# Patient Record
Sex: Female | Born: 1970 | Race: White | Hispanic: No | Marital: Married | Smoking: Former smoker
Health system: Southern US, Community
[De-identification: ages and names within clinical notes are randomized; demographics above are authoritative.]

## PROBLEM LIST (undated history)

## (undated) DIAGNOSIS — K219 Gastro-esophageal reflux disease without esophagitis: Secondary | ICD-10-CM

## (undated) DIAGNOSIS — R51 Headache: Secondary | ICD-10-CM

## (undated) DIAGNOSIS — F32A Depression, unspecified: Secondary | ICD-10-CM

## (undated) DIAGNOSIS — G43909 Migraine, unspecified, not intractable, without status migrainosus: Secondary | ICD-10-CM

## (undated) DIAGNOSIS — F329 Major depressive disorder, single episode, unspecified: Secondary | ICD-10-CM

## (undated) DIAGNOSIS — R519 Headache, unspecified: Secondary | ICD-10-CM

## (undated) DIAGNOSIS — B019 Varicella without complication: Secondary | ICD-10-CM

## (undated) DIAGNOSIS — T7840XA Allergy, unspecified, initial encounter: Secondary | ICD-10-CM

## (undated) HISTORY — DX: Migraine, unspecified, not intractable, without status migrainosus: G43.909

## (undated) HISTORY — DX: Allergy, unspecified, initial encounter: T78.40XA

## (undated) HISTORY — DX: Headache, unspecified: R51.9

## (undated) HISTORY — DX: Major depressive disorder, single episode, unspecified: F32.9

## (undated) HISTORY — PX: OTHER SURGICAL HISTORY: SHX169

## (undated) HISTORY — DX: Varicella without complication: B01.9

## (undated) HISTORY — DX: Gastro-esophageal reflux disease without esophagitis: K21.9

## (undated) HISTORY — DX: Headache: R51

## (undated) HISTORY — DX: Depression, unspecified: F32.A

---

## 1989-02-03 HISTORY — PX: RECONSTRUCTION MID-FACE: SUR1085

## 1993-02-03 HISTORY — PX: TUBAL LIGATION: SHX77

## 2012-04-19 ENCOUNTER — Encounter (HOSPITAL_COMMUNITY): Payer: Self-pay | Admitting: *Deleted

## 2012-04-19 ENCOUNTER — Emergency Department (HOSPITAL_COMMUNITY)
Admission: EM | Admit: 2012-04-19 | Discharge: 2012-04-19 | Disposition: A | Discharge status: Home / Self Care | Payer: BC Managed Care – PPO | Source: Home / Self Care | Attending: Emergency Medicine | Admitting: Emergency Medicine

## 2012-04-19 DIAGNOSIS — M549 Dorsalgia, unspecified: Secondary | ICD-10-CM

## 2012-04-19 MED ORDER — KETOROLAC TROMETHAMINE 60 MG/2ML IM SOLN
60.0000 mg | Freq: Once | INTRAMUSCULAR | Status: AC
  Administered 2012-04-19: 60 mg via INTRAMUSCULAR

## 2012-04-19 MED ORDER — HYDROCODONE-ACETAMINOPHEN 7.5-325 MG PO TABS: 1.0000 | ORAL_TABLET | ORAL | Status: DC | PRN

## 2012-04-19 MED ORDER — KETOROLAC TROMETHAMINE 60 MG/2ML IM SOLN
INTRAMUSCULAR | Status: AC
  Filled 2012-04-19: qty 2

## 2012-04-19 MED ORDER — CYCLOBENZAPRINE HCL 5 MG PO TABS: 5.0000 mg | ORAL_TABLET | Freq: Three times a day (TID) | ORAL | Status: DC | PRN

## 2012-04-19 NOTE — ED Provider Notes (Signed)
Medical screening examination/treatment/procedure(s) were performed by non-physician practitioner and as supervising physician I was immediately available for consultation/collaboration.  Leslee Home, M.D.  Reuben Likes, MD 04/19/12 2114

## 2012-04-19 NOTE — ED Notes (Signed)
PT     REPORTS                 SHE W AS   WORKING   SEV  DAYS AGO   CUTTING  WOOD  WHEN SHE  RAISED  UP  AND  FELT A  PAIN IN  LOWER  BACK      SHE  AMBY=ULATED TO  ROOM WITH A  SLOW STEADY  GAIT

## 2012-04-19 NOTE — ED Provider Notes (Signed)
History     CSN: [id number]  Arrival date & time 04/19/12  1118   First MD Initiated Contact with Patient 04/19/12 1200      Chief Complaint  Patient presents with  . Back Pain    (Consider location/radiation/quality/duration/timing/severity/associated sxs/prior treatment) HPI Comments: 42 year old female presents with mid and low back pain. She had been lifting limbs 3 days ago. The following day he she had no back pain or other problems. Yesterday after waking she was working with a fire and burning that would and squatting down. When she braced herself back up she experienced severe sudden back pain. Pain is located in the lower para thoracic and lumbosacral musculature. Pain is worse with prolonged standing, in the bending or movement. The pain is constant. She is taking NSAIDs which offered no relief. She denies radiation of pain into her legs, weakness in the lower extremities or numbness.   History reviewed. No pertinent past medical history.  Past Surgical History  Procedure Laterality Date  . Btl      History reviewed. No pertinent family history.  History  Substance Use Topics  . Smoking status: Not on file  . Smokeless tobacco: Not on file  . Alcohol Use: Not on file    OB History   Grav Para Term Preterm Abortions TAB SAB Ect Mult Living                  Review of Systems  Constitutional: Negative for fever, activity change and fatigue.  HENT: Negative.   Respiratory: Negative for cough, shortness of breath and wheezing.   Cardiovascular: Negative for chest pain and palpitations.  Gastrointestinal: Negative.   Genitourinary: Negative.   Musculoskeletal: Positive for myalgias and back pain.       As per history of present illness  Skin: Negative for color change, pallor and rash.  Neurological: Negative.   Psychiatric/Behavioral: Negative.     Allergies  Peanuts and Soybeans  Home Medications   Current Outpatient Rx  Name  Route  Sig  Dispense   Refill  . Ibuprofen (ADVIL) 200 MG CAPS   Oral   Take by mouth.         . Liniments (BEN GAY EX)   Apply externally   Apply topically.         . Naproxen Sodium (ALEVE PO)   Oral   Take by mouth.         . cyclobenzaprine (FLEXERIL) 5 MG tablet   Oral   Take 1 tablet (5 mg total) by mouth 3 (three) times daily as needed for muscle spasms.   21 tablet   0   . HYDROcodone-acetaminophen (NORCO) 7.5-325 MG per tablet   Oral   Take 1 tablet by mouth every 4 (four) hours as needed for pain.   15 tablet   0     BP 127/70  Pulse 59  Temp(Src) 98.5 F (36.9 C) (Oral)  Resp 20  SpO2 100%  LMP 04/17/2012  Physical Exam  Nursing note and vitals reviewed. Constitutional: She is oriented to person, place, and time. She appears well-developed and well-nourished. No distress.  HENT:  Head: Normocephalic and atraumatic.  Eyes: EOM are normal.  Neck: Normal range of motion. Neck supple.  Cardiovascular: Normal rate.   Pulmonary/Chest: Effort normal. No respiratory distress.  Musculoskeletal: She exhibits no edema.  Tenderness in the mid parathoracic musculature and greater tenderness in the para lumbosacral musculature. No swelling or deformity. There is mild tenderness across  the lumbar spine the tenderness is produced with very light palpation to the skin only. No weakness in the lower extremities on exam.  Neurological: She is alert and oriented to person, place, and time. No cranial nerve deficit. She exhibits normal muscle tone.  Skin: Skin is warm and dry.  Psychiatric: She has a normal mood and affect.    ED Course  Procedures (including critical care time)  Labs Reviewed - No data to display No results found.   1. Sprain, lumbosacral, initial encounter   2. Acute back pain       MDM  Neurologic symptoms associated with the back pain. Symptoms and exam are compatible with musculoskeletal strain. No heavy lifting bending, twisting, pulling. Rest but did  not go to bed. Start slowly with lowering of stretches as demonstrated and discussed. Apply heat to the low back. Toradol 60 mg IM prior to discharge May continue to take Naprosyn or Aleve at home as needed. Flexeril 5 mg every 8 hours as needed for muscle relaxant. Norco 5 one every 4 hours when necessary pain #15. Followup with your primary care doctor as needed. For any new symptoms problems or worsening may return   Hayden Rasmussen, NP 04/19/12 1326

## 2013-06-08 ENCOUNTER — Other Ambulatory Visit: Payer: Self-pay | Admitting: Obstetrics & Gynecology

## 2013-06-08 ENCOUNTER — Other Ambulatory Visit (HOSPITAL_COMMUNITY)
Admission: RE | Admit: 2013-06-08 | Discharge: 2013-06-08 | Disposition: A | Discharge status: Home / Self Care | Payer: BC Managed Care – PPO | Source: Ambulatory Visit | Attending: Obstetrics & Gynecology | Admitting: Obstetrics & Gynecology

## 2013-06-08 DIAGNOSIS — Z01419 Encounter for gynecological examination (general) (routine) without abnormal findings: Secondary | ICD-10-CM | POA: Insufficient documentation

## 2013-06-08 DIAGNOSIS — Z1151 Encounter for screening for human papillomavirus (HPV): Secondary | ICD-10-CM | POA: Insufficient documentation

## 2013-06-08 LAB — HM PAP SMEAR: HM Pap smear: NEGATIVE

## 2016-01-24 DIAGNOSIS — J029 Acute pharyngitis, unspecified: Secondary | ICD-10-CM | POA: Diagnosis not present

## 2016-07-01 DIAGNOSIS — Z01419 Encounter for gynecological examination (general) (routine) without abnormal findings: Secondary | ICD-10-CM | POA: Diagnosis not present

## 2017-01-20 ENCOUNTER — Encounter: Payer: Self-pay | Admitting: Nurse Practitioner

## 2017-01-20 ENCOUNTER — Ambulatory Visit: Payer: BLUE CROSS/BLUE SHIELD | Admitting: Nurse Practitioner

## 2017-01-20 ENCOUNTER — Ambulatory Visit (INDEPENDENT_AMBULATORY_CARE_PROVIDER_SITE_OTHER): Payer: BLUE CROSS/BLUE SHIELD

## 2017-01-20 VITALS — BP 122/76 | HR 60 | Temp 98.5°F | Ht 66.5 in | Wt 176.0 lb

## 2017-01-20 DIAGNOSIS — M25552 Pain in left hip: Secondary | ICD-10-CM | POA: Diagnosis not present

## 2017-01-20 DIAGNOSIS — M25551 Pain in right hip: Secondary | ICD-10-CM

## 2017-01-20 DIAGNOSIS — M79676 Pain in unspecified toe(s): Secondary | ICD-10-CM | POA: Diagnosis not present

## 2017-01-20 DIAGNOSIS — G8929 Other chronic pain: Secondary | ICD-10-CM | POA: Diagnosis not present

## 2017-01-20 LAB — SEDIMENTATION RATE: SED RATE: 8 mm/h (ref 0–20)

## 2017-01-20 LAB — VITAMIN D 25 HYDROXY (VIT D DEFICIENCY, FRACTURES): VITD: 23.95 ng/mL — ABNORMAL LOW (ref 30.00–100.00)

## 2017-01-20 MED ORDER — DICLOFENAC SODIUM 2 % TD SOLN: 1.0000 [in_us] | Freq: Two times a day (BID) | TRANSDERMAL | 0 refills | Status: AC

## 2017-01-20 NOTE — Patient Instructions (Addendum)
encourage to go half size up in shoe size and wider. Toe pain is due to repeated trauma.  Normal hip  And pelvic x-ray. Normal sed rate and Rh factor. Pending ANA.  Schedule appt with sports medicine to further evaluate hip pain.
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
## 2017-01-21 ENCOUNTER — Encounter: Payer: Self-pay | Admitting: Nurse Practitioner

## 2017-01-21 ENCOUNTER — Ambulatory Visit: Payer: BLUE CROSS/BLUE SHIELD | Admitting: Family Medicine

## 2017-01-21 DIAGNOSIS — M7062 Trochanteric bursitis, left hip: Secondary | ICD-10-CM

## 2017-01-21 DIAGNOSIS — G8929 Other chronic pain: Secondary | ICD-10-CM | POA: Insufficient documentation

## 2017-01-21 DIAGNOSIS — M7061 Trochanteric bursitis, right hip: Secondary | ICD-10-CM | POA: Insufficient documentation

## 2017-01-21 DIAGNOSIS — F324 Major depressive disorder, single episode, in partial remission: Secondary | ICD-10-CM | POA: Insufficient documentation

## 2017-01-21 DIAGNOSIS — R51 Headache: Secondary | ICD-10-CM

## 2017-01-21 DIAGNOSIS — M79676 Pain in unspecified toe(s): Secondary | ICD-10-CM

## 2017-01-21 DIAGNOSIS — K219 Gastro-esophageal reflux disease without esophagitis: Secondary | ICD-10-CM | POA: Insufficient documentation

## 2017-01-21 LAB — RHEUMATOID FACTOR

## 2017-01-21 LAB — ANA: Anti Nuclear Antibody(ANA): NEGATIVE

## 2017-01-21 MED ORDER — MELOXICAM 15 MG PO TABS: 15.0000 mg | ORAL_TABLET | Freq: Every day | ORAL | 0 refills | Status: DC

## 2017-01-21 NOTE — Assessment & Plan Note (Addendum)
GT bursitis is likely occurring. Has some weakness with glute med testing as the origin to the pain. Doesn't seem to be piriformis or sciatica stemming from her back.  - mobic, if doesn't tolerate then she can stop. If doesn't tolerate could consider prednisone.  - has pennsaid ordered - counseled on HEP - if on improvement can consider GT injection or PT. If still having pain could consider an intra articular injection as it could be a labral problem.
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
## 2017-01-21 NOTE — Assessment & Plan Note (Signed)
Use of tums as needed Triggered by certain foods.

## 2017-01-21 NOTE — Patient Instructions (Signed)
Please try the exercises  Please follow up with me in 4 weeks if this isn't getting any better.

## 2017-01-21 NOTE — Assessment & Plan Note (Signed)
Triggered by fatigue and increased stress. Onset in 1994 after epidural attempt during childbirth, which led to spinal fluid leak. Associated symptoms: light and sound sensitivity, nausea, fatigue, blurry vision Improves with darkness, rest, and occasional use of aleve.

## 2017-02-17 ENCOUNTER — Encounter: Payer: Self-pay | Admitting: Nurse Practitioner

## 2017-02-17 NOTE — Progress Notes (Signed)
Abstracted result and sent to scan  

## 2017-02-18 ENCOUNTER — Other Ambulatory Visit: Payer: Self-pay | Admitting: Family Medicine

## 2017-02-23 ENCOUNTER — Ambulatory Visit: Payer: BLUE CROSS/BLUE SHIELD | Admitting: Nurse Practitioner

## 2017-02-23 DIAGNOSIS — Z0289 Encounter for other administrative examinations: Secondary | ICD-10-CM

## 2018-09-14 DIAGNOSIS — Z13220 Encounter for screening for lipoid disorders: Secondary | ICD-10-CM | POA: Diagnosis not present

## 2018-09-14 DIAGNOSIS — M79674 Pain in right toe(s): Secondary | ICD-10-CM | POA: Diagnosis not present

## 2018-09-14 DIAGNOSIS — E559 Vitamin D deficiency, unspecified: Secondary | ICD-10-CM | POA: Diagnosis not present

## 2018-09-14 DIAGNOSIS — M2550 Pain in unspecified joint: Secondary | ICD-10-CM | POA: Diagnosis not present

## 2018-09-14 DIAGNOSIS — Z13228 Encounter for screening for other metabolic disorders: Secondary | ICD-10-CM | POA: Diagnosis not present

## 2018-09-14 DIAGNOSIS — M25541 Pain in joints of right hand: Secondary | ICD-10-CM | POA: Diagnosis not present

## 2018-09-14 DIAGNOSIS — Z130 Encounter for screening for diseases of the blood and blood-forming organs and certain disorders involving the immune mechanism: Secondary | ICD-10-CM | POA: Diagnosis not present

## 2018-09-14 DIAGNOSIS — M25542 Pain in joints of left hand: Secondary | ICD-10-CM | POA: Diagnosis not present

## 2018-09-14 DIAGNOSIS — B379 Candidiasis, unspecified: Secondary | ICD-10-CM | POA: Diagnosis not present

## 2018-10-05 DIAGNOSIS — M79651 Pain in right thigh: Secondary | ICD-10-CM | POA: Diagnosis not present

## 2018-10-05 DIAGNOSIS — M545 Low back pain: Secondary | ICD-10-CM | POA: Diagnosis not present

## 2018-10-05 DIAGNOSIS — M25551 Pain in right hip: Secondary | ICD-10-CM | POA: Diagnosis not present

## 2018-10-05 DIAGNOSIS — R200 Anesthesia of skin: Secondary | ICD-10-CM | POA: Diagnosis not present

## 2019-06-03 IMAGING — DX DG HIP (WITH OR WITHOUT PELVIS) 2V BILAT
5 series · 5 of 5 positions shown · non-contrast
Comparison: None.

CLINICAL DATA: Is chronic bilateral hip pain.  No known injury.

EXAM:
DG HIP (WITH OR WITHOUT PELVIS) 2V BILAT

[pelvis ap]
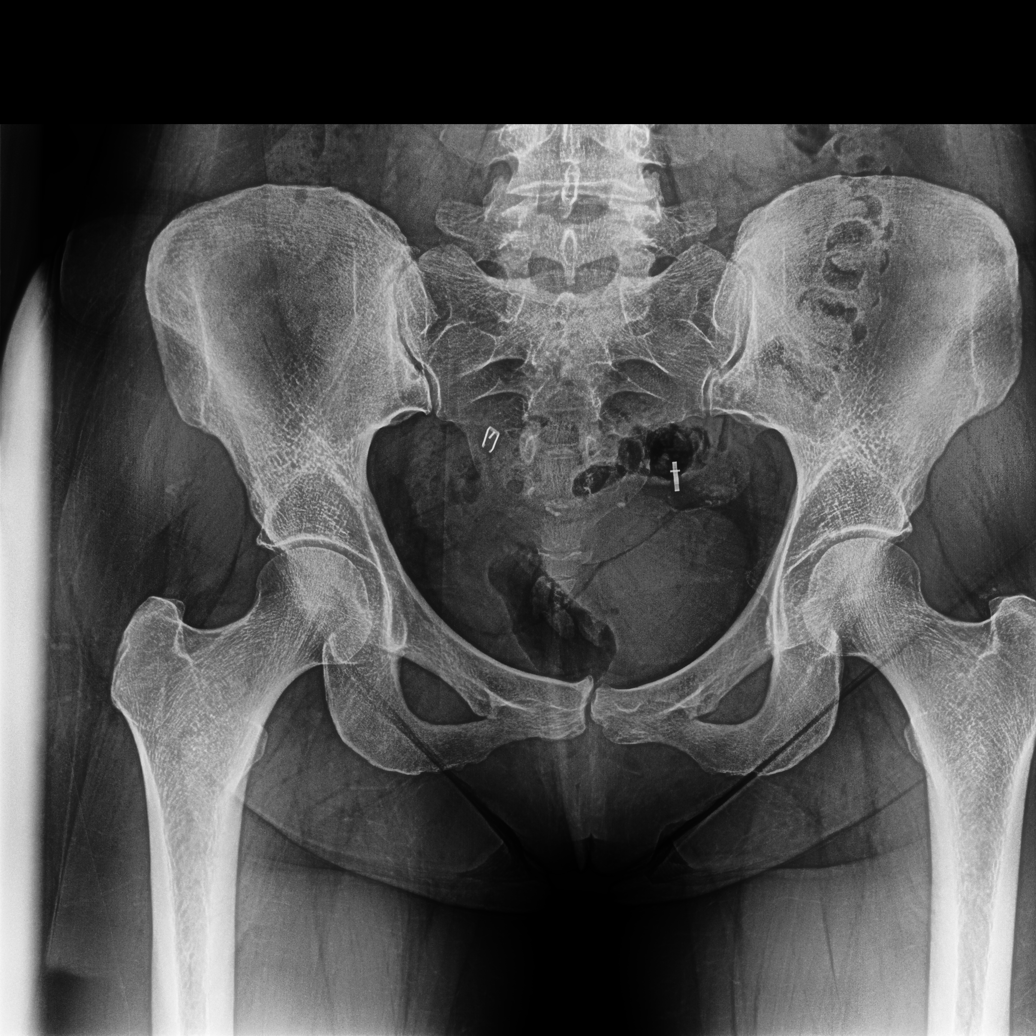

[hip joint ap (1 of 2)]
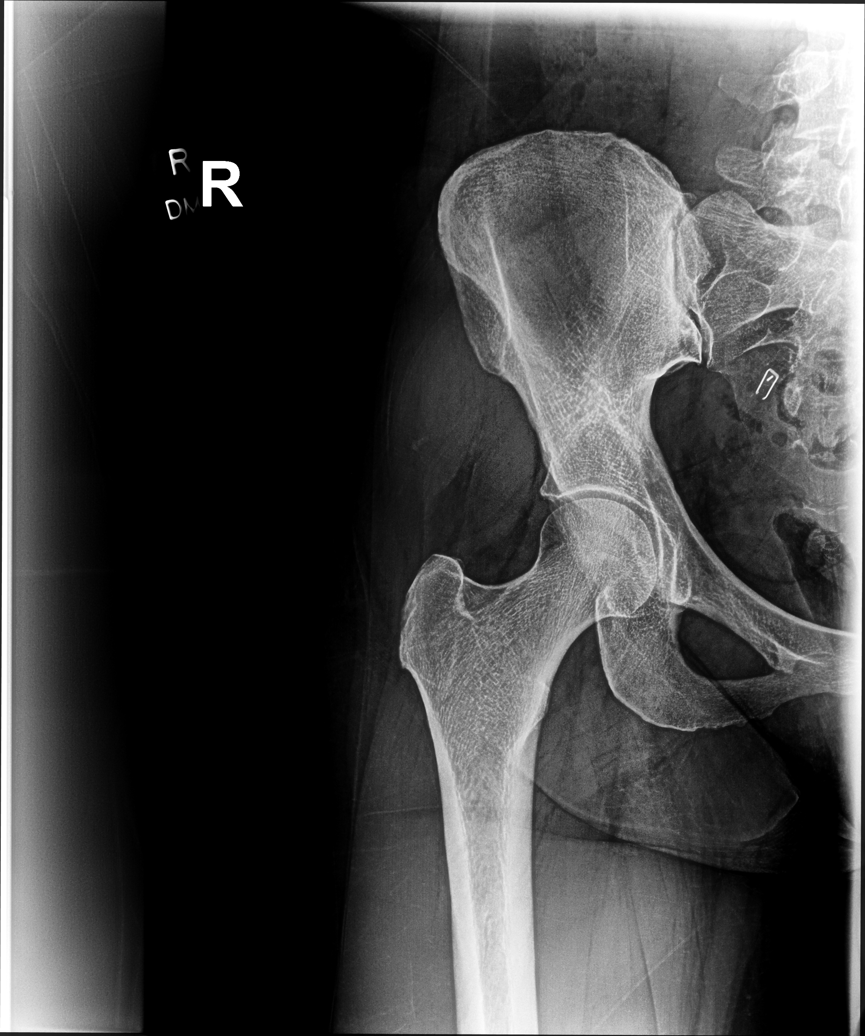

[hip frog leg lat (1 of 2)]
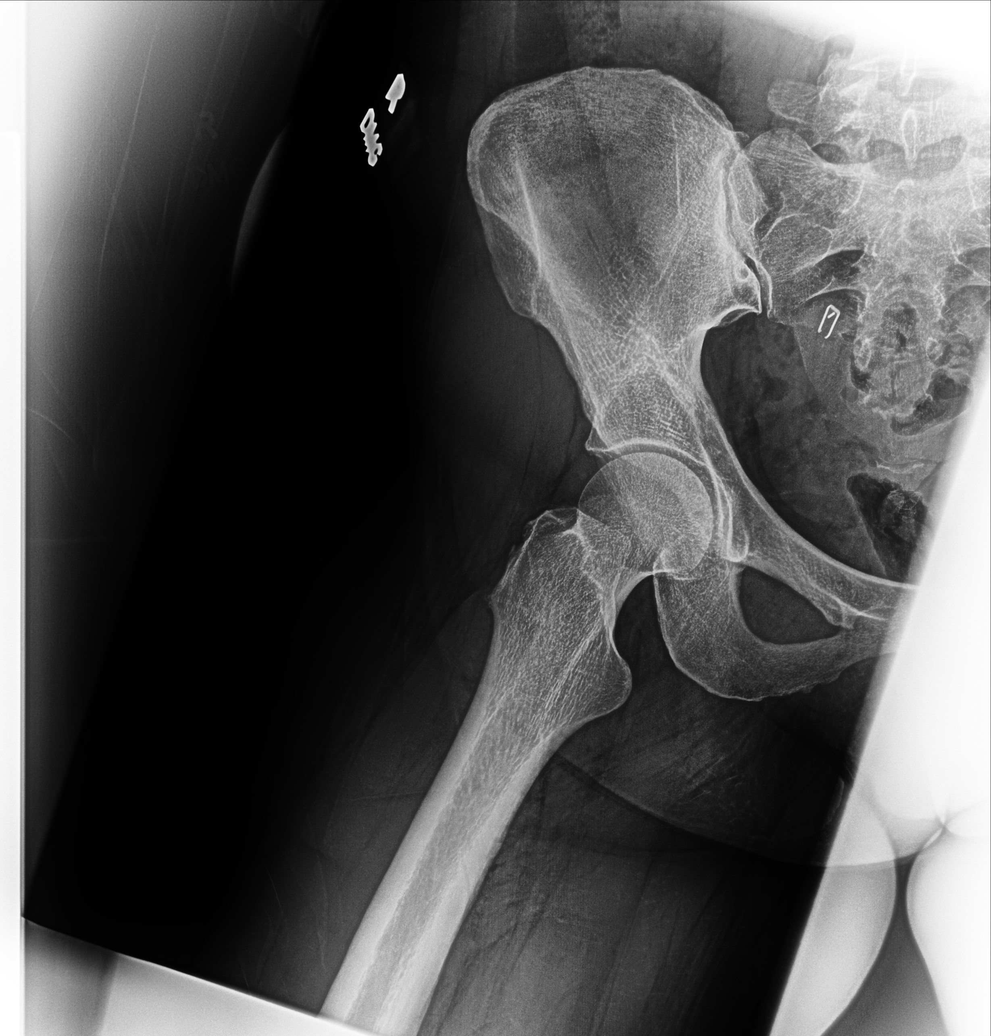

[hip joint ap (2 of 2)]
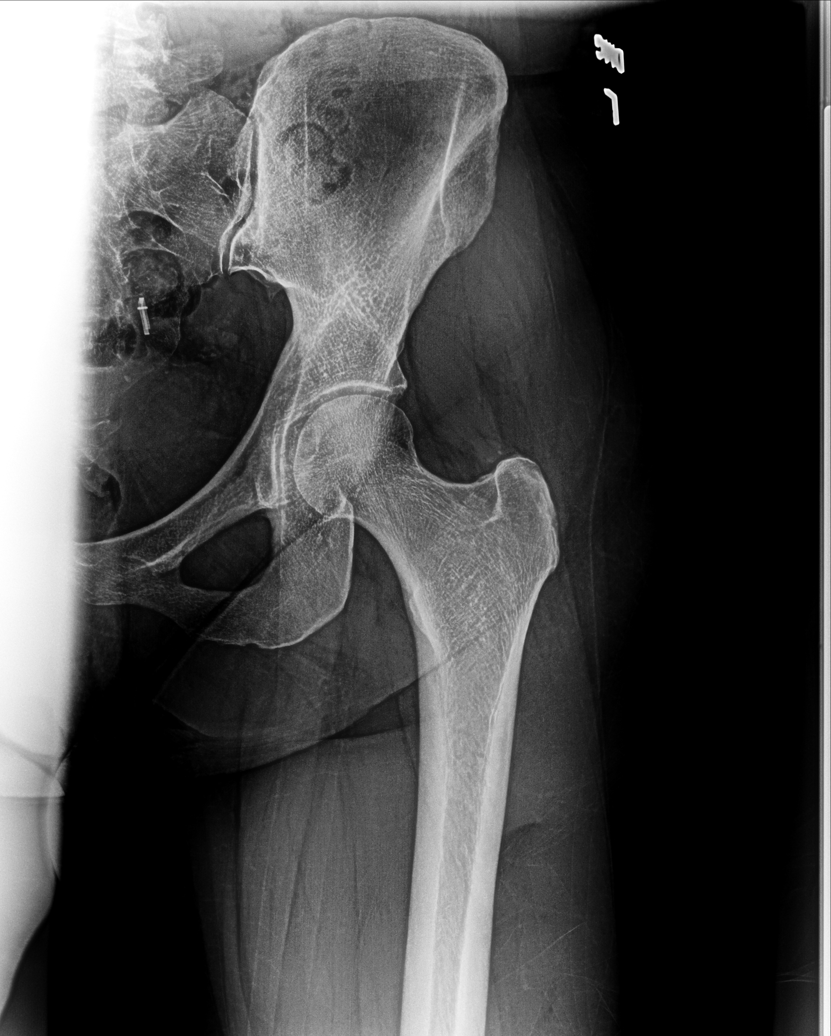

[hip frog leg lat (2 of 2)]
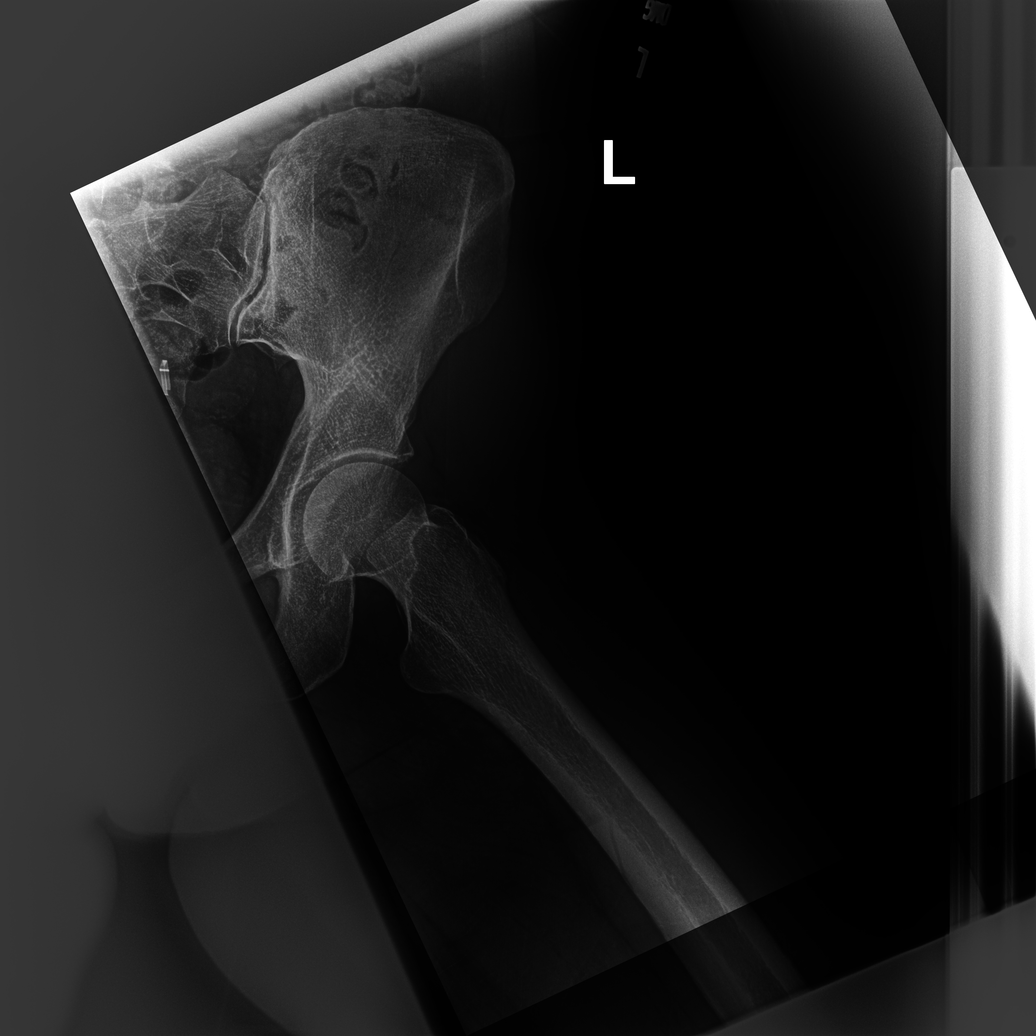

[5 of 5 positions shown; findings below may reference images not displayed]

FINDINGS: There is no evidence of hip fracture or dislocation. There is no
evidence of arthropathy or other focal bone abnormality. Tubal
ligation clips noted.
IMPRESSION: Normal-appearing hips.

## 2019-06-05 ENCOUNTER — Ambulatory Visit
Admission: EM | Admit: 2019-06-05 | Discharge: 2019-06-05 | Disposition: A | Discharge status: Home / Self Care | Payer: BC Managed Care – PPO | Attending: Physician Assistant | Admitting: Physician Assistant

## 2019-06-05 ENCOUNTER — Ambulatory Visit (INDEPENDENT_AMBULATORY_CARE_PROVIDER_SITE_OTHER): Payer: BC Managed Care – PPO

## 2019-06-05 ENCOUNTER — Other Ambulatory Visit: Payer: Self-pay

## 2019-06-05 DIAGNOSIS — M25571 Pain in right ankle and joints of right foot: Secondary | ICD-10-CM

## 2019-06-05 DIAGNOSIS — S82831A Other fracture of upper and lower end of right fibula, initial encounter for closed fracture: Secondary | ICD-10-CM

## 2019-06-05 MED ORDER — TRAMADOL HCL 50 MG PO TABS: 50.0000 mg | ORAL_TABLET | Freq: Three times a day (TID) | ORAL | 0 refills | Status: AC | PRN

## 2019-06-05 NOTE — ED Triage Notes (Signed)
Patient is here for evaluation of right foot injury that occurred today while mowing the lawn.  Pushing the mower up a hill her foot slipped and she felt a "pop".

## 2019-06-05 NOTE — Discharge Instructions (Signed)
Right ankle fracture.  Keep the cam walker on.  Crutches, nonweightbearing until cleared by orthopedics.  Take ibuprofen 800 mg 3 times a day.  If needed, can supplement with Tylenol 1000 mg 3 times a day.  Can add on tramadol as needed for further pain relief.  Continue ice compress, elevation.  Please call orthopedics tomorrow for further evaluation and management needed.

## 2019-06-05 NOTE — ED Provider Notes (Signed)
[facility]    CSN: [id number] Arrival date & time: 06/05/19  1232      History   Chief Complaint Chief Complaint  Patient presents with  . Foot Injury    HPI Brooke Jacobs is a 49 y.o. female.   49 year old female comes in for right ankle pain after injury.  States was mowing the lawn, going uphill.  States caught her foot, causing it to hyperextend, and heard a pop.  She was able to ambulate slightly after incident, and then was unable to bear weight due to pain.  Has significant swelling and bruising to the lateral ankle and therefore came in for evaluation. Took ibuprofen.      Past Medical History:  Diagnosis Date  . Allergy   . Chicken pox   . Depression   . Frequent headaches   . GERD (gastroesophageal reflux disease)    Heart Burn  . Migraine     Patient Active Problem List   Diagnosis Date Noted  . Greater trochanteric bursitis of both hips 01/21/2017  . Chronic pain of toe 01/21/2017  . Depression, major, single episode, in partial remission (HCC) 01/21/2017  . Gastroesophageal reflux disease without esophagitis 01/21/2017  . Chronic headaches 01/21/2017    Past Surgical History:  Procedure Laterality Date  . btl    . RECONSTRUCTION MID-FACE Right 1991  . TUBAL LIGATION  1995    OB History   No obstetric history on file.      Home Medications    Prior to Admission medications   Medication Sig Start Date End Date Taking? Authorizing Provider  Ascorbic Acid (VITAMIN C PO) Take 240 mg by mouth.    [provider]  Diclofenac Sodium (PENNSAID) 2 % SOLN Place 1 inch onto the skin 2 times daily at 12 noon and 4 pm. 01/20/17   Nche, Bonna Gains, NP  Ibuprofen (ADVIL) 200 MG CAPS Take by mouth.    [provider]  Liniments (BEN GAY EX) Apply topically.    [provider]  Multiple Vitamins-Minerals (MULTIVITAMIN ADULT PO) Take by mouth. Calcium,magnesium,zinc,D3    [provider]  Naproxen Sodium  (ALEVE PO) Take by mouth.    [provider]  NON FORMULARY Billberry extract, blueberry, lutein    [provider]  NON FORMULARY Type II Collagen+Boron+ HA    [provider]  traMADol (ULTRAM) 50 MG tablet Take 1 tablet (50 mg total) by mouth every 8 (eight) hours as needed. 06/05/19   Cathie Hoops, Emaley Applin V, PA-C  TURMERIC CURCUMIN PO Take by mouth. Ginger    [provider]    Family History Family History  Problem Relation Age of Onset  . Schizophrenia Mother   . Arthritis Mother   . Depression Mother   . Mental retardation Mother   . Miscarriages / [place] Mother   . Heart disease Father 63  . Hypertension Father   . Stroke Father   . Asthma Daughter   . Depression Daughter   . Learning disabilities Daughter   . Cancer Maternal Grandmother 30       breast, colon, uterine  . COPD Maternal Grandmother   . Mental retardation Maternal Grandmother   . Alcohol abuse Maternal Grandfather   . Cancer Maternal Grandfather 55       lung secondary to tobacco use  . COPD Maternal Grandfather   . Early death Maternal Grandfather   . Heart disease Maternal Grandfather   . Kidney disease Maternal Grandfather   .  Mental illness Maternal Grandfather   . Heart disease Paternal Grandmother   . COPD Paternal Grandmother   . Hypertension Paternal Grandmother   . Mental retardation Paternal Grandmother   . Heart disease Paternal Grandfather   . Hypertension Paternal Grandfather   . Depression Sister   . Learning disabilities Sister   . Asthma Brother   . Depression Brother   . Learning disabilities Brother   . Birth defects Sister   . Depression Sister   . Learning disabilities Sister   . Arthritis Daughter   . Depression Daughter   . Hypertension Daughter   . Learning disabilities Daughter   . Mental illness Daughter   . Birth defects Daughter        klippel feil syndrome  . Early death Daughter   . Hearing loss Daughter   . Heart disease Daughter      Social History Social History   Tobacco Use  . Smoking status: Former Research scientist (life sciences)  . Smokeless tobacco: Never Used  Substance Use Topics  . Alcohol use: Yes    Comment: social  . Drug use: No     Allergies   Soybeans [soybean oil], Aspirin, and Peanuts [peanut oil]   Review of Systems Review of Systems  Reason unable to perform ROS: See HPI as above.     Physical Exam Triage Vital Signs ED Triage Vitals  Enc Vitals Group     BP 06/05/19 1255 (!) 142/84     Pulse Rate 06/05/19 1255 74     Resp 06/05/19 1255 18     Temp 06/05/19 1255 98.1 F (36.7 C)     Temp Source 06/05/19 1255 Oral     SpO2 06/05/19 1255 98 %     Weight --      Height --      Head Circumference --      Peak Flow --      Pain Score 06/05/19 1315 4     Pain Loc --      Pain Edu? --      Excl. in [facility]? --    No data found.  Updated Vital Signs BP (!) 142/84 (BP Location: Left Arm)   Pulse 74   Temp 98.1 F (36.7 C) (Oral)   Resp 18   LMP 05/25/2019 (Approximate)   SpO2 98%   Visual Acuity Right Eye Distance:   Left Eye Distance:   Bilateral Distance:    Right Eye Near:   Left Eye Near:    Bilateral Near:     Physical Exam Constitutional:      General: She is not in acute distress.    Appearance: Normal appearance. She is well-developed. She is not toxic-appearing or diaphoretic.  HENT:     Head: Normocephalic and atraumatic.  Eyes:     Conjunctiva/sclera: Conjunctivae normal.     Pupils: Pupils are equal, round, and reactive to light.  Pulmonary:     Effort: Pulmonary effort is normal. No respiratory distress.     Comments: Speaking in full sentences without difficulty Musculoskeletal:     Cervical back: Normal range of motion and neck supple.     Comments: Significant swelling and contusion to the right lateral ankle overlying malleolus.  No tenderness to palpation of the foot.  Range of motion and strength deferred due to x-ray results.  Sensation intact, pedal pulse 2+.   Skin:    General: Skin is warm and dry.  Neurological:     Mental Status: She is  alert and oriented to person, place, and time.      UC Treatments / Results  Labs (all labs ordered are listed, but only abnormal results are displayed) Labs Reviewed - No data to display  EKG   Radiology DG Ankle Complete Right  Result Date: 06/05/2019 CLINICAL DATA:  Right ankle pain and swelling after fall this afternoon. EXAM: RIGHT ANKLE - COMPLETE 3+ VIEW COMPARISON:  None. FINDINGS: Minimally displaced oblique fracture of the distal fibular metaphysis at the level of the ankle mortise. Ankle mortise is otherwise within normal. Remainder the exam is unremarkable. IMPRESSION: Minimally displaced oblique fracture of the distal fibular metaphysis. Electronically Signed   By: Elberta Fortis M.D.   On: 06/05/2019 13:39    Procedures Procedures (including critical care time)  Medications Ordered in UC Medications - No data to display  Initial Impression / Assessment and Plan / UC Course  I have reviewed the triage vital signs and the nursing notes.  Pertinent labs & imaging results that were available during my care of the patient were reviewed by me and considered in my medical decision making (see chart for details).    Discussed x-ray results.  Will put patient in cam walker, crutches.  Patient to remain nonweightbearing until testing results return.  Symptomatic treatment with NSAIDs and Tylenol at this time.  Tramadol if needed for further pain relief.  Patient to follow-up with orthopedics for further evaluation and management needed.  Return precautions given.  Patient expresses understanding and agrees to plan.  Final Clinical Impressions(s) / UC Diagnoses   Final diagnoses:  Other closed fracture of distal end of right fibula, initial encounter   ED Prescriptions    Medication Sig Dispense Auth. Provider   traMADol (ULTRAM) 50 MG tablet Take 1 tablet (50 mg total) by mouth every 8  (eight) hours as needed. 15 tablet Belinda Fisher, PA-C     I have reviewed the PDMP during this encounter.   Belinda Fisher, PA-C 06/05/19 1451
